# Patient Record
Sex: Female | Born: 1995
Health system: Southern US, Community
[De-identification: ages and names within clinical notes are randomized; demographics above are authoritative.]

## PROBLEM LIST (undated history)

## (undated) DIAGNOSIS — T7840XA Allergy, unspecified, initial encounter: Secondary | ICD-10-CM

## (undated) HISTORY — DX: Allergy, unspecified, initial encounter: T78.40XA

---
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
## 2014-06-21 ENCOUNTER — Other Ambulatory Visit: Payer: Self-pay | Admitting: Family Medicine

## 2014-06-21 ENCOUNTER — Ambulatory Visit
Admission: RE | Admit: 2014-06-21 | Discharge: 2014-06-21 | Disposition: A | Discharge status: Home / Self Care | Payer: 59 | Source: Ambulatory Visit | Attending: Family Medicine | Admitting: Family Medicine

## 2014-06-21 DIAGNOSIS — S8991XA Unspecified injury of right lower leg, initial encounter: Secondary | ICD-10-CM | POA: Diagnosis not present

## 2014-06-21 DIAGNOSIS — R52 Pain, unspecified: Secondary | ICD-10-CM

## 2014-06-21 DIAGNOSIS — Y939 Activity, unspecified: Secondary | ICD-10-CM | POA: Insufficient documentation

## 2014-06-21 DIAGNOSIS — W2107XA Struck by softball, initial encounter: Secondary | ICD-10-CM | POA: Diagnosis not present

## 2014-10-28 DIAGNOSIS — M25522 Pain in left elbow: Secondary | ICD-10-CM | POA: Diagnosis not present

## 2014-11-04 ENCOUNTER — Other Ambulatory Visit: Payer: Self-pay | Admitting: Family Medicine

## 2014-11-04 DIAGNOSIS — M25522 Pain in left elbow: Secondary | ICD-10-CM | POA: Diagnosis not present

## 2014-11-12 ENCOUNTER — Ambulatory Visit
Admission: RE | Admit: 2014-11-12 | Discharge: 2014-11-12 | Disposition: A | Discharge status: Home / Self Care | Payer: 59 | Source: Ambulatory Visit | Attending: Family Medicine | Admitting: Family Medicine

## 2014-11-12 DIAGNOSIS — M25522 Pain in left elbow: Secondary | ICD-10-CM | POA: Insufficient documentation

## 2015-01-20 ENCOUNTER — Ambulatory Visit: Payer: PRIVATE HEALTH INSURANCE | Admitting: Physical Therapy

## 2015-01-20 ENCOUNTER — Ambulatory Visit: Payer: 59 | Admitting: Physical Therapy

## 2015-01-24 ENCOUNTER — Encounter: Payer: PRIVATE HEALTH INSURANCE | Admitting: Physical Therapy

## 2015-01-24 ENCOUNTER — Ambulatory Visit: Payer: 59 | Attending: Family Medicine | Admitting: Physical Therapy

## 2015-01-24 ENCOUNTER — Encounter: Payer: Self-pay | Admitting: Physical Therapy

## 2015-01-24 DIAGNOSIS — M25522 Pain in left elbow: Secondary | ICD-10-CM | POA: Diagnosis not present

## 2015-01-24 NOTE — Therapy (Signed)
[place]INE [address] [place], [address] Phone: [phone]   Fax:  [phone]  Physical Therapy Evaluation  Patient Details  Name: Teresa Sims MRN: [id number] Date of Birth: [birth year] Referring Provider: Allena Katz  Encounter Date: 01/24/2015      PT End of Session - 01/24/15 1723    Visit Number 1   Number of Visits 13   Date for PT Re-Evaluation 03/09/15   PT Start Time 1618   PT Stop Time 1718   PT Time Calculation (min) 60 min   Activity Tolerance Patient tolerated treatment well   Behavior During Therapy [facility] for tasks assessed/performed      Past Medical History  Diagnosis Date  . Allergy     No past surgical history on file.  There were no vitals filed for this visit.  Visit Diagnosis:  Left elbow pain - Plan: PT plan of care cert/re-cert      Subjective Assessment - 01/24/15 1623    Subjective left elbow pain with throwing.   Pertinent History Pt has had left elbow pain for about 1 year. 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 Denies N/T, shoulder pain, neck pain.    Diagnostic tests MRI - negative   Patient Stated Goals return to throwing pain free. being able to lift weights.   Currently in Pain? No/denies            [facility] PT Assessment - 01/24/15 0001    Assessment   Medical Diagnosis Elbow muscle group tenderness/tendinopathy   Referring Provider Patel   Hand Dominance Left   Prior Therapy none   Precautions   Precautions None   Balance Screen   Has the patient fallen in the past 6 months No   Has the patient had a decrease in activity level because of a fear of falling?  No   Is the patient reluctant to leave their home because of a fear of falling?  No   ROM / Strength   AROM / PROM / Strength AROM;Strength   AROM   Overall AROM Comments R UE all movements WNL. L UE limited supination by 40 deg., decr.  radial deviation (to neutral), pain with end range active and passive wrist flexion and extension with elbow bent and straight.   Strength   Overall Strength Comments all strength is 5/5 except brachoradialis 3/5 with pain, resisted wrist flexion, wrist extension in end range both 4/5.   Palpation   Palpation comment pain with alpation of common extensor tendon, pronator teres, supinator, FCR and FCU.       Objective: Trigger point dry needling performed on multiple points: FCU, FCR, ECT, multiple needles, piston, piston and twist, maceration techniques used.  Following this pt reported her forearm was very sore but had decr. Pain with end range wrist extension. No change with limited radial deviation.  Carpoulnar glides performed to facilitate radial deviation, 5x1 min grade III. Improved radial deviation by 10 degr.  Elbow medial glides 5x1 min. Following this pt had improvement in supination to WNL.                    PT Education - 01/24/15 1722    Education provided Yes   Education Details educated pt to try throwing with her father over the next day or two to assess baseline throwing pain.   Person(s) Educated Patient  Methods Explanation   Comprehension Verbalized understanding             PT Long Term Goals - 01/24/15 1726    PT LONG TERM GOAL #1   Title pt will be able to throw softball 30 min pain free to be able to participate in scholarship sport in college.   Baseline pain with 20 min.   Time 6   Period Weeks   Status New   PT LONG TERM GOAL #2   Title Pt will improve L brachoradialis MMT to 5/5 pain free to reduce compensatory bricep activation with throwing.   Baseline 3/5   Time 6   Period Weeks   Status New               Plan - 01/24/15 1723    Clinical Impression Statement Pt is a pleasant 19 y/o female with c/o chronic L shoulder pain with throwing and lifting activities. 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 Currently pt presents with impaired joint play in L elbow and wrist, significant stiffness in L elbow and wrist, muscle weakness with L wrist extension, L elbow flexion in neutral to bias brachoradialis, and pain. pt would benefit from skilled PT to address these issues and allow return to PLOF.   Pt will benefit from skilled therapeutic intervention in order to improve on the following deficits Pain;Impaired UE functional use;Decreased range of motion;Improper body mechanics;Decreased strength   Rehab Potential Good   Clinical Impairments Affecting Rehab Potential chronic pain, age, activity level, throwing requirements, scholarship athlete   PT Frequency 2x / week   PT Duration 6 weeks   PT Treatment/Interventions ADLs/Self Care Home Management;Passive range of motion;Dry needling;Therapeutic exercise;Manual techniques   PT Next Visit Plan reassess pt, dry needling, manual tx, progress to sustained stretches.   Consulted and Agree with Plan of Care Patient         Problem List There are no active problems to display for this patient.   Aylyn Wenzler PT DPT 01/24/2015, 5:29 PM  [facility] [facility] [address] [place], [place], [address] Phone: [phone]   Fax:  [phone]  Name: Teresa Sims MRN: [id number] Date of Birth: 09/09/1995

## 2015-02-02 ENCOUNTER — Ambulatory Visit: Payer: 59 | Admitting: Physical Therapy

## 2015-02-02 DIAGNOSIS — M25522 Pain in left elbow: Secondary | ICD-10-CM | POA: Diagnosis not present

## 2015-02-02 NOTE — Therapy (Signed)
[facility] [address] [place], [place], [address] Phone: [phone]   Fax:  [phone]  Physical Therapy Treatment  Patient Details  Name: Teresa Sims MRN: [id number] Date of Birth: 08/14/1995 Referring Provider: Allena KatzPatel  Encounter Date: 02/02/2015      PT End of Session - 02/02/15 1026    Visit Number 2   Number of Visits 13   Date for PT Re-Evaluation 03/09/15   PT Start Time 0945   PT Stop Time 1015   PT Time Calculation (min) 30 min   Activity Tolerance Patient tolerated treatment well   Behavior [facility] Therapy [facility] for tasks assessed/performed      Past Medical History  Diagnosis Date  . Allergy     No past surgical history on file.  There were no vitals filed for this visit.  Visit Diagnosis:  Left elbow pain      Subjective Assessment - 02/02/15 0948    Subjective Pt tried throwing with her father, felt some improvement but only able to throw 20-30 times at 275' before noticing stiffness in elbow.   Pertinent History Pt has had left elbow pain for about 1 year. 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 Denies N/T, shoulder pain, neck pain.    Diagnostic tests MRI - negative   Patient Stated Goals return to throwing pain free. being able to lift weights.   Currently in Pain? No/denies                  objective: Reviewed and corrected stretches for wrist flexors.  Trigger point dry needling in triceps, pronator teres, wrist flexor common muscle belly. Twist and piston technique used.  Medial and lateral elbow glides 3x45 sec. Each grade IV with pt performing active wrist flexion to extension with and without finger addition.  Push ups - pt able to perform well.  Modified push up for triceps activation - pt had difficulty with this. Encouraged pt to perform this at home on knees.               PT Education -  02/02/15 1025    Education provided Yes   Education Details modification of warmup routine with stretching.   Person(s) Educated Patient   Methods Explanation   Comprehension Verbalized understanding             PT Long Term Goals - 01/24/15 1726    PT LONG TERM GOAL #1   Title pt will be able to throw softball 30 min pain free to be able to participate in scholarship sport in college.   Baseline pain with 20 min.   Time 6   Period Weeks   Status New   PT LONG TERM GOAL #2   Title Pt will improve L brachoradialis MMT to 5/5 pain free to reduce compensatory bricep activation with throwing.   Baseline 3/5   Time 6   Period Weeks   Status New               Plan - 02/02/15 1026    Clinical Impression Statement Pt demonstrates continued weakness in L elbow extension, pain with palpation of triceps. Improvement in L wrist supination. No change in pain. Modified pt performance of warmup to add in stretches as throwing becomes painful to attempt to mediate/improve endurance for exercises.   Pt will benefit from skilled therapeutic intervention in order to  improve on the following deficits Pain;Impaired UE functional use;Decreased range of motion;Improper body mechanics;Decreased strength   Rehab Potential Good   Clinical Impairments Affecting Rehab Potential chronic pain, age, activity level, throwing requirements, scholarship athlete   PT Frequency 2x / week   PT Duration 6 weeks   PT Treatment/Interventions ADLs/Self Care Home Management;Passive range of motion;Dry needling;Therapeutic exercise;Manual techniques   PT Next Visit Plan reassess pt, dry needling, manual tx, progress to sustained stretches.   Consulted and Agree with Plan of Care Patient        Problem List There are no active problems to display for this patient.   Oni Dietzman PT DPT 02/02/2015, 10:28 AM  [place] [facility] PHYS[facility] AND SPORTS MEDICINE [address] [place], [address] Phone: [phone]   Fax:  [phone]  Name: Teresa Sims MRN: [id number] Date of Birth: [birth year]

## 2015-02-03 ENCOUNTER — Encounter: Payer: PRIVATE HEALTH INSURANCE | Admitting: Physical Therapy

## 2015-02-15 ENCOUNTER — Encounter: Payer: PRIVATE HEALTH INSURANCE | Admitting: Physical Therapy

## 2015-02-18 ENCOUNTER — Ambulatory Visit: Payer: 59 | Attending: Family Medicine | Admitting: Physical Therapy

## 2015-02-18 DIAGNOSIS — M25522 Pain in left elbow: Secondary | ICD-10-CM | POA: Diagnosis present

## 2015-02-18 NOTE — Therapy (Signed)
[facility] [address] [place], [place], [address] Phone: [phone]   Fax:  [phone]  Physical Therapy Treatment  Patient Details  Name: Teresa Sims MRN: [id number] Date of Birth: 09/22/1995 Referring Provider: Allena KatzPatel  Encounter Date: 02/18/2015      PT End of Session - 02/18/15 0815    Visit Number 3   Number of Visits 13   Date for PT Re-Evaluation 03/09/15   PT Start Time 0730   PT Stop Time 0805   PT Time Calculation (min) 35 min   Activity Tolerance Patient tolerated treatment well   Behavior During Therapy [facility] for tasks assessed/performed      Past Medical History  Diagnosis Date  . Allergy     No past surgical history on file.  There were no vitals filed for this visit.  Visit Diagnosis:  Left elbow pain      Subjective Assessment - 02/18/15 0741    Subjective pt reports she has decreased pain at rest as well as post softball practice. Able to throw many times at sub max speed w/o pain/stiffness   Pertinent History Pt has had left elbow pain for about 1 year. 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 Denies N/T, shoulder pain, neck pain.    Diagnostic tests MRI - negative   Patient Stated Goals return to throwing pain free. being able to lift weights.   Currently in Pain? No/denies       Objective:   Pt performed reverse chops w/ green resistance band 2 sec holds at top of elevation and 3 sec lowering 10X3 w/ verbal and tactile cueing on the low/mid trap to increase activation.  Performed TRX scapular retractions in Y positioning X10 w/ 3 sec holds at top. Progressed to oscillation sets at mid ROM 6X3. With oscillation set more challenging.   Isometric perturbations w/ 6.5# medicine ball against wall, shoulder abd to 90 deg elbow flexed to 90 deg X10, elbow straight shoulder flexed to 140 deg X10, shoulder flexed to 180 deg bent  over at hips. Verbal and tactile cueing of mid/low trap to increase activation.  Dry needling performed at distal triceps with two needles utilizing pistoning and twisting techniques. Pt responded well, reported decr. Ability to produce her own pain by palpating. Grade 4 Lateral glide mobilizations performed at L elbow X 2 for bouts of 45 sec.                              PT Education - 02/18/15 0755    Education Details pt was instructed to hold off on max throwing speed untill after next visit and provided no pain   Person(s) Educated Patient   Methods Explanation   Comprehension Verbalized understanding             PT Long Term Goals - 01/24/15 1726    PT LONG TERM GOAL #1   Title pt will be able to throw softball 30 min pain free to be able to participate in scholarship sport in college.   Baseline pain with 20 min.   Time 6   Period Weeks   Status New   PT LONG TERM GOAL #2   Title Pt will improve L brachoradialis MMT to 5/5 pain free to reduce compensatory bricep activation with throwing.   Baseline 3/5   Time 6  Period Weeks   Status New               Plan - 02/18/15 0817    Clinical Impression Statement Pt is progressing well as seen by a decrease in pain, increased endurance in throwing capacity and strength, continues to have tenderness just distal to medial L epicondyle   Pt will benefit from skilled therapeutic intervention in order to improve on the following deficits Pain;Impaired UE functional use;Decreased range of motion;Improper body mechanics;Decreased strength   Rehab Potential Good   Clinical Impairments Affecting Rehab Potential chronic pain, age, activity level, throwing requirements, scholarship athlete   PT Frequency 2x / week   PT Duration 6 weeks   PT Treatment/Interventions ADLs/Self Care Home Management;Passive range of motion;Dry needling;Therapeutic exercise;Manual techniques   PT Next Visit Plan reassess pt,  manual tx, progress to increased endurance in low/mid trap isometrics   Consulted and Agree with Plan of Care Patient        Problem List There are no active problems to display for this patient.   Rodrigus Kilker PT DPT 02/18/2015, 9:01 AM  [facility] PHYS[facility] AND SPORTS MEDICINE [address], [place], [address] Phone: [phone]   Fax:  [phone]  Name: Teresa Sims MRN: [id number] Date of Birth: 22-Jul-1995

## 2015-02-18 NOTE — Patient Instructions (Signed)
Pt instructed to not throw at max capacity until she is seen next time

## 2015-02-22 ENCOUNTER — Ambulatory Visit: Payer: 59 | Admitting: Physical Therapy

## 2015-02-22 DIAGNOSIS — M25522 Pain in left elbow: Secondary | ICD-10-CM | POA: Diagnosis not present

## 2015-02-22 NOTE — Therapy (Signed)
[place] [facility] [address] [place], [address] Phone: [phone]   Fax:  [phone]  Physical Therapy Treatment  Patient Details  Name: Teresa Sims MRN: [id number] Date of Birth: 01/05/1996 Referring Provider: Allena Katz  Encounter Date: 02/22/2015      PT End of Session - 02/22/15 0811    Visit Number 4   Number of Visits 13   Date for PT Re-Evaluation 03/09/15   PT Start Time 0730   PT Stop Time 0810   PT Time Calculation (min) 40 min   Activity Tolerance Patient tolerated treatment well;No increased pain   Behavior During Therapy [facility] for tasks assessed/performed      Past Medical History  Diagnosis Date  . Allergy     No past surgical history on file.  There were no vitals filed for this visit.  Visit Diagnosis:  Left elbow pain      Subjective Assessment - 02/22/15 0729    Subjective Pt reports she did the kind of throwing she would do in a game when throwing out a runner during her last practice, which produced her previous pain symptoms immediately following 12 pitches. She is currently more sore than just distal to medial epicondyle.   Pertinent History Pt has had left elbow pain for about 1 year. 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 Denies N/T, shoulder pain, neck pain.    Diagnostic tests MRI - negative   Patient Stated Goals return to throwing pain free. being able to lift weights.            Objective: Half kneeling w/ knee on foam pad pt performed elbow extensions in combination with forearm pronation w/ gray resistance band 3X12 to address pain with pronation with throwing and elbow extension Attempted dips w/ feet on floor knees straight back to mat/ pt experienced discomfort at L medial epicondyle. Discontinued exercise to reduce fatigue/discomfort in consideration to later softball practice.  Isometric perturbations  w/ 6.5# medicine ball against wall, shoulder abd to 90 deg elbow flexed to 90 deg, elbow straight shoulder flexed to 140 deg X10, shoulder flexed to 180 deg bent over at hips. Each position was sustained 60 sec for the first set then 45 sec X 2. Pt extremely fatigued with performance indicating continued weakness in scapula  3 sets of grade 4 lateral glide mobilizations at elbow performed 45 sec w/ elbow straight and pt seated. STM done just distal to medial epicondyle/triceps insertion between lat glide sets. Pt responded well to therapy and had a notable decrease in tissue tension distal to medial epicondyle.   Dry needling performed at proximal flexor/pronator group as well as ECRB utilizing pistoning and twisting techniques. Pt responded well, with decr. Pain following LTR in ECRB.                     PT Education - 02/22/15 0808    Education Details pt was encouraged to continue throwing during practice and be aware of how it impacts the pain.    Person(s) Educated Patient   Methods Explanation             PT Long Term Goals - 01/24/15 1726    PT LONG TERM GOAL #1   Title pt will be able to throw softball 30 min pain free to be able to participate in scholarship sport in college.   Baseline pain  with 20 min.   Time 6   Period Weeks   Status New   PT LONG TERM GOAL #2   Title Pt will improve L brachoradialis MMT to 5/5 pain free to reduce compensatory bricep activation with throwing.   Baseline 3/5   Time 6   Period Weeks   Status New               Plan - 02/22/15 1610    Clinical Impression Statement Pt is improving in throwing endurance, but remains limited by soreness. Pt will benefit from continued scapula stabilization training to decrease stresses at the medial elbow. Pt will benefit from flexor/pronator strengthening to improve throwing endurance.  Pt also continues to have pain with palpation of proximal forearm and would benefit from continued  PT to address this.   Pt will benefit from skilled therapeutic intervention in order to improve on the following deficits Pain;Impaired UE functional use;Decreased range of motion;Improper body mechanics;Decreased strength   Rehab Potential Good   Clinical Impairments Affecting Rehab Potential chronic pain, age, activity level, throwing requirements, scholarship athlete   PT Frequency 2x / week   PT Duration 6 weeks   PT Treatment/Interventions ADLs/Self Care Home Management;Passive range of motion;Dry needling;Therapeutic exercise;Manual techniques   PT Next Visit Plan reassess pt, manual tx, progress to increased endurance in low/mid trap isometrics   Consulted and Agree with Plan of Care Patient        Problem List There are no active problems to display for this patient.   Karstyn Birkey PT DPT 02/22/2015, 10:08 AM   [facility] PHYSICAL AND SPORTS MEDICINE [address], [place], [address] Phone: [phone]   Fax:  [phone]  Name: Teresa Sims MRN: [id number] Date of Birth: 1995/07/03

## 2015-03-01 ENCOUNTER — Ambulatory Visit: Payer: 59 | Admitting: Physical Therapy

## 2015-03-01 DIAGNOSIS — M25522 Pain in left elbow: Secondary | ICD-10-CM

## 2015-03-01 NOTE — Therapy (Signed)
[place] [facility]TS MEDIC[facility] [address] [place], [address] Phone: [phone]   Fax:  [phone]  Physical Therapy Treatment  Patient Details  Name: Adelina Collard MRN: [id number] Date of Birth: 23-Jan-1996 Referring Provider: Allena Katz  Encounter Date: 03/01/2015      PT End of Session - 03/01/15 0910    Visit Number 5   Number of Visits 13   Date for PT Re-Evaluation 03/09/15   PT Start Time 0730   PT Stop Time 0810   PT Time Calculation (min) 40 min   Activity Tolerance Patient tolerated treatment well;No increased pain   Behavior During Therapy [facility] for tasks assessed/performed      Past Medical History  Diagnosis Date  . Allergy     No past surgical history on file.  There were no vitals filed for this visit.  Visit Diagnosis:  Left elbow pain      Subjective Assessment - 03/01/15 0806    Subjective Pt reports "it's feeling better for sure", reports has not been practicing softball over the last two days and will not have practice today. Reports that she feels the "shoulder exercises" have been most beneficial of the exercises we have done so far   Pertinent History Pt has had left elbow pain for about 1 year. 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 Denies N/T, shoulder pain, neck pain.    Diagnostic tests MRI - negative   Patient Stated Goals return to throwing pain free. being able to lift weights.   Currently in Pain? No/denies          Objective:  Performed overhead resisted throwing motion w/ GTB attached over door to address med epicondyle pain w/ throwing. Pt tolerated well, reported eccentric portion was most difficult portion, and verbalize fatigue in the area of supraspinatus and mid trap.  3X15 3 sec count eccentric and concentric portions. Manually resisted pronation in standing to address pain with resisted pronation. 3X10 concentric  pronation. 3X10 eccentric pronation. Pt had noted decrease in strength and mild pain at med epicondyle as pronation approached neutral, decreased resistance applied.  Static rows done on cable machine 35# 4X30 sec. Done to increase scapular retraction/depression during resisted motions. Verbal/tactile cues needed to depress/retract scapula.  Lat glide mob at L elbow to address med elbow pain. Noted taut band from med epicondyle to mid forearm. 3 bouts at 45 sec, pt tolerated well.  Ulnar glide mob of hand pt in sitting to address radial deviation deficit. Pt tolerated well, demonstrated increased radial deviation post mob Dry needling of common wrist extensor, two needles inserted w/ twitch response evident.                        PT Education - 03/01/15 0909    Education provided Yes   Education Details instructed to continue static rows with scapualer retraction focus   Person(s) Educated Patient   Methods Explanation;Demonstration   Comprehension Verbalized understanding;Returned demonstration;Verbal cues required             PT Long Term Goals - 01/24/15 1726    PT LONG TERM GOAL #1   Title pt will be able to throw softball 30 min pain free to be able to participate in scholarship sport in college.   Baseline pain with 20 min.   Time 6   Period Weeks   Status New  PT LONG TERM GOAL #2   Title Pt will improve L brachoradialis MMT to 5/5 pain free to reduce compensatory bricep activation with throwing.   Baseline 3/5   Time 6   Period Weeks   Status New               Plan - 03/01/15 0911    Clinical Impression Statement Pt is showing improved endurance in throwing capacity/volume and decreased soreness. Reassess med epicondyle/triceps/BR pain post softball practice of high intensity. Advance scapular stabilization progression, continue dry needling prn, as these are what pt has reported help with pain management.     Pt will benefit from skilled  therapeutic intervention in order to improve on the following deficits Pain;Impaired UE functional use;Decreased range of motion;Improper body mechanics;Decreased strength   Rehab Potential Good   Clinical Impairments Affecting Rehab Potential chronic pain, age, activity level, throwing requirements, scholarship athlete   PT Frequency 2x / week   PT Duration 6 weeks   PT Treatment/Interventions ADLs/Self Care Home Management;Passive range of motion;Dry needling;Therapeutic exercise;Manual techniques   PT Next Visit Plan reassess pt, manual tx, progress to increased endurance in low/mid trap isometrics   Consulted and Agree with Plan of Care Patient        Problem List There are no active problems to display for this patient.   Emilia Beck Rij SPT 03/01/2015, 9:51 AM  Su Hoff PT DPT  [place] [facility] PHYSICAL AND SPORTS MEDICINE [address] [place], [address] Phone: [phone]   Fax:  [phone]  Name: Jordyn Hofacker MRN: [id number] Date of Birth: [birth year]

## 2015-03-08 ENCOUNTER — Ambulatory Visit: Payer: 59 | Admitting: Physical Therapy

## 2015-03-08 DIAGNOSIS — M25522 Pain in left elbow: Secondary | ICD-10-CM

## 2015-03-08 NOTE — Therapy (Signed)
[facility] [address] [place], [address] Phone: [phone]   Fax:  [phone]  Physical Therapy Treatment  Patient Details  Name: Teresa Sims MRN: [id number] Date of Birth: 27-Feb-1995 Referring Provider: Allena Katz  Encounter Date: 03/08/2015      PT End of Session - 03/08/15 1220    Visit Number 6   Number of Visits 13   Date for PT Re-Evaluation 03/09/15   PT Start Time 1045   PT Stop Time 1130   PT Time Calculation (min) 45 min   Activity Tolerance Patient tolerated treatment well;No increased pain   Behavior During Therapy [facility] for tasks assessed/performed      Past Medical History  Diagnosis Date  . Allergy     No past surgical history on file.  There were no vitals filed for this visit.  Visit Diagnosis:  Left elbow pain      Subjective Assessment - 03/08/15 1047    Subjective Pt reports she is having more pain in med epicondyle/ant forearm in the last few days, also reports experiencing pain when "sliding during softball, I realized I drag my hand when sliding" in a manner that stretches flexor/pronator group of wrist. Reports increase pain with sliding/dragging hand med epicondyle. Triceps and forearm pain remain the same as they have been.   Pertinent History Pt has had left elbow pain for about 1 year. 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 Denies N/T, shoulder pain, neck pain.    Diagnostic tests MRI - negative   Patient Stated Goals return to throwing pain free. being able to lift weights.   Currently in Pain? No/denies      Objective:  Performed single arm bent over rows w/ 20# 2X10, 25# 1X10 3 sec holds with retraction. Pt responded well, requiring occasional manual cueing to gain full scapula retraction/depression. Performed prone horizontal shoulder abduction w/o wt. for time 3X60 sec. Pt demonstrated decreased  ER/supination/abduction of L UE. Reported tenderness at deltoids insertion/biceps/supraspinatus area.  Performed ischemic compression of supraspinatus combined w/ AROM horizontal abd/ER/supination 2X10. Followed with MWM horizontal abduction/ER of shoulder combined w/ STM of supraspinatus 2X10. Pt responded well as reported by decreased UE tension/TP and full ROM upon demonstration of prone horizontal abduction.                              PT Education - 03/08/15 1055    Education provided Yes   Education Details educated to to contract glues and ER arms during prone horizontal arm abduction   Person(s) Educated Patient   Methods Explanation;Demonstration;Tactile cues;Verbal cues   Comprehension Returned demonstration;Verbalized understanding             PT Long Term Goals - 03/08/15 1230    PT LONG TERM GOAL #1   Title pt will be able to throw softball 30 min pain free to be able to participate in scholarship sport in college.   Baseline pain with 20 min.   Time 6   Period Weeks   Status Achieved   PT LONG TERM GOAL #2   Title Pt will improve L brachoradialis MMT to 5/5 pain free to reduce compensatory bricep activation with throwing.   Baseline 3/5   Time 6   Period Weeks   Status New   PT LONG TERM GOAL #3   Title Pt  will be able to throw 20 full capacity softballs with 0/10 pain to enable unrestricted softball practice/gams   Baseline 12 before pain onset   Time 4   Period Weeks   Status New               Plan - 03/08/15 1221    Clinical Impression Statement Pt has limited capacity when throwing at high speeds or at lower speeds for longer durations during softball due to med epicondyle pain, assess for continued TP/tightness in triceps, deltoids, and medial forearm. Develop effective shoulder stabilization HEP as this is what pt has reported to be most beneficial.    Pt will benefit from skilled therapeutic intervention in order to improve  on the following deficits Pain;Impaired UE functional use;Decreased range of motion;Improper body mechanics;Decreased strength   Rehab Potential Good   Clinical Impairments Affecting Rehab Potential chronic pain, age, activity level, throwing requirements, scholarship athlete   PT Frequency 2x / week   PT Duration 6 weeks   PT Treatment/Interventions ADLs/Self Care Home Management;Passive range of motion;Dry needling;Therapeutic exercise;Manual techniques   PT Next Visit Plan reassess pt, manual tx, progress to increased endurance in low/mid trap isometrics   Consulted and Agree with Plan of Care Patient        Problem List There are no active problems to display for this patient.   Emilia Beck Rij 03/08/2015, 12:34 PM  Su Hoff PT DPT  [facility] PHYS[facility] AND SPORTS MEDICINE [address] [place], [address] Phone: [phone]   Fax:  [phone]  Name: Teresa Sims MRN: [id number] Date of Birth: 28-Feb-1995

## 2015-03-10 ENCOUNTER — Encounter: Payer: Self-pay | Admitting: Physical Therapy

## 2015-03-10 ENCOUNTER — Ambulatory Visit: Payer: 59 | Attending: Family Medicine | Admitting: Physical Therapy

## 2015-03-10 DIAGNOSIS — M25522 Pain in left elbow: Secondary | ICD-10-CM | POA: Diagnosis not present

## 2015-03-10 DIAGNOSIS — M6281 Muscle weakness (generalized): Secondary | ICD-10-CM | POA: Insufficient documentation

## 2015-03-10 NOTE — Therapy (Signed)
[facility] [address], [place], [address] Phone: [phone]   Fax:  [phone]  Physical Therapy Treatment  Patient Details  Name: Teresa Sims MRN: [id number] Date of Birth: 09-03-1995 Referring Provider: Allena Katz  Encounter Date: 03/10/2015      PT End of Session - 03/10/15 1123    Visit Number 7   Number of Visits 13   Date for PT Re-Evaluation 03/09/15   PT Start Time 1045   PT Stop Time 1125   PT Time Calculation (min) 40 min   Activity Tolerance Patient tolerated treatment well;No increased pain   Behavior During Therapy [facility] for tasks assessed/performed      Past Medical History  Diagnosis Date  . Allergy     History reviewed. No pertinent past surgical history.  There were no vitals filed for this visit.  Visit Diagnosis:  Left elbow pain      Subjective Assessment - 03/10/15 1045    Subjective Pt reports soreness in L bicep at rest and incr duirng softball practice.  States that hitting slightly brings on pain but no other problems noted.   Pertinent History Pt has had left elbow pain for about 1 year. 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 Denies N/T, shoulder pain, neck pain.    Diagnostic tests MRI - negative   Patient Stated Goals return to throwing pain free. being able to lift weights.   Currently in Pain? No/denies   Multiple Pain Sites No       Objective:  Dry needling - one needle in L deltoid, one in bicep, one in flexor muscles, and one in pronator teres.  Pt reported some incr soreness after needling due to a high muscle response at noted TPs. 3x10 SL ER 4# first set and 3# for second and third sets with L UE to incr strength and decr pain.  Cuing needed to use a three count for both concentric and eccentric contractions.  Pt reported an incr in difficulty when slowing down the repetitions. 3x10 reverse chops GTB  with L UE to incr strength to allow pt to throw and hit softball efficiently.  Cuing needed to maintain scapular retraction and depression.  Pt reported no pain during exercise. Manually resisted pronation 3x10 to improve strength in R forearm.  PT noted improved strength in the second half of resisted pronation with no incr in pain reported by pt.                          PT Education - 03/10/15 1127    Education provided Yes   Education Details Pt educated on progression of therapy as she begins her softball season.  Also educated to perform static row holds and isometric superman.   Person(s) Educated Patient   Methods Explanation;Demonstration;Verbal cues   Comprehension Verbalized understanding;Returned demonstration             PT Long Term Goals - 03/08/15 1230    PT LONG TERM GOAL #1   Title pt will be able to throw softball 30 min pain free to be able to participate in scholarship sport in college.   Baseline pain with 20 min.   Time 6   Period Weeks   Status Achieved   PT LONG TERM GOAL #2   Title Pt will improve L brachoradialis MMT to 5/5 pain free  to reduce compensatory bricep activation with throwing.   Baseline 3/5   Time 6   Period Weeks   Status New   PT LONG TERM GOAL #3   Title Pt will be able to throw 20 full capacity softballs with 0/10 pain to enable unrestricted softball practice/gams   Baseline 12 before pain onset   Time 4   Period Weeks   Status New               Plan - 03/10/15 1123    Clinical Impression Statement Pt demonstrated significant improvement in scapular strength as evidenced by decr difficulty with exercises and ability to maintain strength gains from session to session.  Dry needling continues to help pt by decr pain in L UE at several noted TPs that were tender to palpation in the L deltoid, bicep, and forearm.  Moving forward, pt will be seen for dry needling PRN and would continue to benefit from skilled  PT to address lingering deficits.   Pt will benefit from skilled therapeutic intervention in order to improve on the following deficits Pain;Impaired UE functional use;Decreased range of motion;Improper body mechanics;Decreased strength   Rehab Potential Good   Clinical Impairments Affecting Rehab Potential chronic pain, age, activity level, throwing requirements, scholarship athlete   PT Frequency 2x / week   PT Duration 6 weeks   PT Treatment/Interventions ADLs/Self Care Home Management;Passive range of motion;Dry needling;Therapeutic exercise;Manual techniques   PT Next Visit Plan reassess pt, manual tx, progress to increased endurance in low/mid trap isometrics   Consulted and Agree with Plan of Care Patient        Problem List There are no active problems to display for this patient.   Vilinda Flake SPT 03/10/2015, 11:31 AM  Su Hoff PT DPT  [facility] PHYSICAL AND SPORTS MEDICINE [address], [place], [address] Phone: [phone]   Fax:  [phone]  Name: Teresa Sims MRN: [id number] Date of Birth: [birth year]

## 2015-03-15 ENCOUNTER — Ambulatory Visit: Payer: 59 | Admitting: Physical Therapy

## 2015-03-15 DIAGNOSIS — M25522 Pain in left elbow: Secondary | ICD-10-CM

## 2015-03-15 DIAGNOSIS — M6281 Muscle weakness (generalized): Secondary | ICD-10-CM

## 2015-03-15 NOTE — Therapy (Signed)
[facility] [address], [place], [address] Phone: [phone]   Fax:  [phone]  Physical Therapy Treatment  Patient Details  Name: Teresa Sims MRN: [id number] Date of Birth: [birth year] Referring Provider: Allena Katz  Encounter Date: 03/15/2015      PT End of Session - 03/15/15 1131    Visit Number 8   Number of Visits 13   Date for PT Re-Evaluation 04/20/15   PT Start Time 1015   PT Stop Time 1100   PT Time Calculation (min) 45 min   Activity Tolerance Patient tolerated treatment well;No increased pain   Behavior During Therapy [facility] for tasks assessed/performed      Past Medical History  Diagnosis Date  . Allergy     No past surgical history on file.  There were no vitals filed for this visit.  Visit Diagnosis:  Left elbow pain  Muscle weakness      Subjective Assessment - 03/15/15 1026    Subjective Pt reports no pain, and feeling better than last session likely due to prolonged 3 day rest break from softball. Pt states she "feels ready for the game" this weekend, and is able to throw more with soreness lasting only .    Pertinent History Pt has had left elbow pain for about 1 year. 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 Denies N/T, shoulder pain, neck pain.    Diagnostic tests MRI - negative   Patient Stated Goals return to throwing pain free. being able to lift weights.   Currently in Pain? No/denies       Objective:   Performed dry needling of brachiradialis, needle insertion at two location, pistoning techniquw with local twitch response noted. Decreased tissue tension noted post treatment.  TRX Ys and Ts performed 2X10, pt tolerated well reporting fatigue/soreness in medial trap area. Cueing needed to decrease shoulder elevation.   TRX static row holds 3X30 sec, pt tolerated well reporting this being the most difficult exercise  done in therapy to date. Cueing needed to decrease shoulder elevation.   Cable Triceps extensions w/ emphasis on pronation w/ terminal elbow extension, 3X10 at 15# X10, 20# 2X10. Cueing needed to increase pronation, shoulder depression/retraction.  Seated wrist extension w/ 5# DB over edge of table 3X12. Pt tolerated well reporting burning in BR/common extensor region.                             PT Education - 03/15/15 1034    Education provided Yes   Education Details discussed HEP components and options for long term maintenance   Person(s) Educated Patient   Methods Explanation;Demonstration   Comprehension Verbalized understanding;Returned demonstration             PT Long Term Goals - 03/08/15 1230    PT LONG TERM GOAL #1   Title pt will be able to throw softball 30 min pain free to be able to participate in scholarship sport in college.   Baseline pain with 20 min.   Time 6   Period Weeks   Status Achieved   PT LONG TERM GOAL #2   Title Pt will improve L brachoradialis MMT to 5/5 pain free to reduce compensatory bricep activation with throwing.   Baseline 3/5   Time 6   Period Weeks   Status New   PT LONG TERM  GOAL #3   Title Pt will be able to throw 20 full capacity softballs with 0/10 pain to enable unrestricted softball practice/gams   Baseline 12 before pain onset   Time 4   Period Weeks   Status New               Plan - 03/15/15 1132    Clinical Impression Statement Pt continues to experience mild pain at medial epicondyle, brachioradialis, triceps, and biceps aggravated by high volume and high velocity softball throwing; however, pain is manageable with HEP therapeutic exercises. Currently soreness lasts only on one day after high volume/capacity throwing. Address HEP options and exercise for continued long term maintenance.     Pt will benefit from skilled therapeutic intervention in order to improve on the following deficits  Pain;Impaired UE functional use;Decreased range of motion;Improper body mechanics;Decreased strength   Rehab Potential Good   Clinical Impairments Affecting Rehab Potential chronic pain, age, activity level, throwing requirements, scholarship athlete   PT Frequency 2x / week   PT Duration 6 weeks   PT Treatment/Interventions ADLs/Self Care Home Management;Passive range of motion;Dry needling;Therapeutic exercise;Manual techniques   PT Next Visit Plan reassess pt, manual tx, progress to increased endurance in low/mid trap isometrics   Consulted and Agree with Plan of Care Patient        Problem List There are no active problems to display for this patient.   Emilia Beck Rij SPT 03/15/2015, 11:51 AM  Su Hoff PT DPT PT supervised student throughout session.  [facility]IC[facility] [address], [place], [address] Phone: [phone]   Fax:  [phone]  Name: Teresa Sims MRN: [id number] Date of Birth: 24-Nov-1995

## 2015-03-22 ENCOUNTER — Ambulatory Visit: Payer: 59 | Admitting: Physical Therapy

## 2015-03-22 DIAGNOSIS — M6281 Muscle weakness (generalized): Secondary | ICD-10-CM

## 2015-03-22 DIAGNOSIS — M25522 Pain in left elbow: Secondary | ICD-10-CM | POA: Diagnosis not present

## 2015-03-22 NOTE — Therapy (Signed)
[facility] [address], [place], [address] Phone: [phone]   Fax:  [phone]  Physical Therapy Treatment/Discharge  Patient Details  Name: Teresa Sims MRN: [id number] Date of Birth: 07/18/1995 Referring Provider: Allena Katz  Encounter Date: 03/22/2015      PT End of Session - 03/22/15 1254    Visit Number 8   Number of Visits 13   Date for PT Re-Evaluation 04/20/15   PT Start Time 1115   PT Stop Time 1200   PT Time Calculation (min) 45 min   Activity Tolerance Patient tolerated treatment well;No increased pain   Behavior During Therapy [facility] for tasks assessed/performed      Past Medical History  Diagnosis Date  . Allergy     No past surgical history on file.  There were no vitals filed for this visit.  Visit Diagnosis:  No diagnosis found.      Subjective Assessment - 03/22/15 1249    Subjective Pt reports winning her softball games over the weekend without incident of increased pain beyond typical levels after throwing activities. She is currently experiencing mild pain at the superior half of brachioradiallis and distal inch of triceps, but no noticeable pain in the area of the medial epicondyle.     Pertinent History Pt has had left elbow pain for about 1 year. 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 Denies N/T, shoulder pain, neck pain.    Diagnostic tests MRI - negative   Patient Stated Goals return to throwing pain free. being able to lift weights.   Currently in Pain? No/denies        Objective:  Performed dry needling of brachioradialis, distal triceps, and slightly distal to medial epicondyle. Utilized pistoning technique, notable twitch response in triceps. Pt tolerated well reporting needles being in the right spot.  Performed superman variation w/ only UE elevated off and max shoulder ER 3X60 sec. Pt required cueing to  increase scapular retraction, difficulty maintain position for full 60 sec.   Performed STM from mid posterior forearm to common extensor tendon and as well as distal inch triceps. Pt reported posterior forearm being very sore with STM. Pt tolerated treatment well reporting feeling better post STM.  Unilateral bent over rows done w/ 40# kettle bell 3X10, contralateral arm was used to stabilize/WB pt on mat, pt tolerated well showing difficulty completing final repetitions of the last set.                            PT Education - 03/22/15 1252    Education provided Yes   Education Details performance of HEP exercises for prevention of aggravation    Person(s) Educated Patient   Methods Explanation;Demonstration   Comprehension Returned demonstration;Verbalized understanding             PT Long Term Goals - 03/08/15 1230    PT LONG TERM GOAL #1   Title pt will be able to throw softball 30 min pain free to be able to participate in scholarship sport in college.   Baseline pain with 20 min.   Time 6   Period Weeks   Status Achieved   PT LONG TERM GOAL #2   Title Pt will improve L brachoradialis MMT to 5/5 pain free to reduce compensatory bricep activation with throwing.   Baseline 3/5   Time 6  Period Weeks   Status New   PT LONG TERM GOAL #3   Title Pt will be able to throw 20 full capacity softballs with 0/10 pain to enable unrestricted softball practice/gams   Baseline 12 before pain onset   Time 4   Period Weeks   Status New               Plan - 03/22/15 1256    Clinical Impression Statement Pt shows considerable and consistent improvement in baseline pain levels, although some irritability occurs with prolonged throwing activities during softball practice.  Pt has demonstrated considerable scapula stabilization and UE strength improvements. Pt demonstrates consistent ability to self-manage symptoms upon mild aggravation through independent  performance of HEP. Pt has been provided with a long-term HEP for maintenance and prevention of aggravation.     Pt will benefit from skilled therapeutic intervention in order to improve on the following deficits Pain;Impaired UE functional use;Decreased range of motion;Improper body mechanics;Decreased strength   Rehab Potential Good   Clinical Impairments Affecting Rehab Potential chronic pain, age, activity level, throwing requirements, scholarship athlete   PT Frequency 2x / week   PT Duration 6 weeks   PT Treatment/Interventions ADLs/Self Care Home Management;Passive range of motion;Dry needling;Therapeutic exercise;Manual techniques   PT Next Visit Plan reassess pt, manual tx, progress to increased endurance in low/mid trap isometrics   Consulted and Agree with Plan of Care Patient        Problem List There are no active problems to display for this patient.   Emilia Beck Rij 03/22/2015, 12:57 PM  Su Hoff PT DPT  [facility] PHYSICAL AND SPORTS MEDICINE [address], [place], [address] Phone: [phone]   Fax:  [phone]  Name: Teresa Sims MRN: [id number] Date of Birth: April 18, 1995

## 2016-06-20 IMAGING — MR MR ELBOW*L* W/O CM
4 series · 40 of 40 positions shown · non-contrast
Comparison: None.

CLINICAL DATA: Softball player with elbow pain for 6 months. Good
range of motion. No acute injury or prior relevant surgery. Initial
encounter.

EXAM:
MRI OF THE LEFT ELBOW WITHOUT CONTRAST
TECHNIQUE: Multiplanar, multisequence MR imaging of the elbow was performed. No
intravenous contrast was administered.

[Series 6: T1 · axial · 4.0mm · 0.62mm/px · z∈[-85,+37]mm · 10 of 25 slices shown]
[im 1/25]
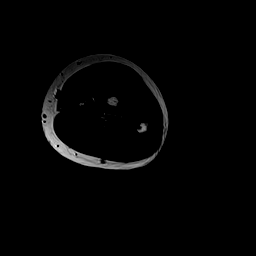
[im 3/25]
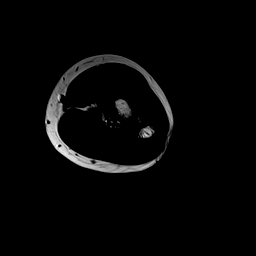
[im 6/25]
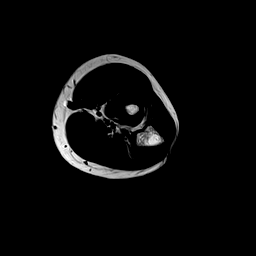
[im 9/25]
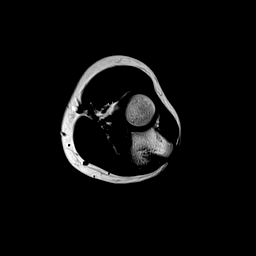
[im 11/25]
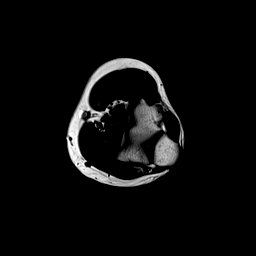
[im 14/25]
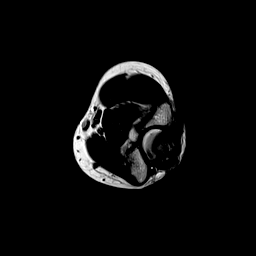
[im 17/25]
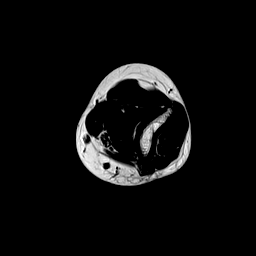
[im 19/25]
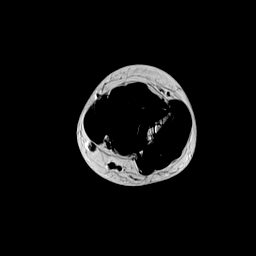
[im 22/25]
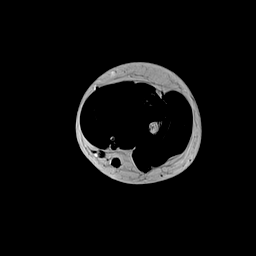
[im 25/25]
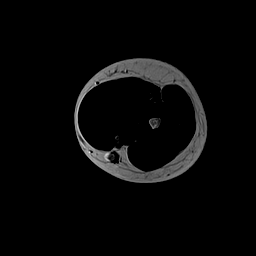

[Series 7: T2 fat-sat · axial · 4.0mm · 0.62mm/px · z∈[-85,+37]mm · 10 of 25 slices shown]
[im 1/25]
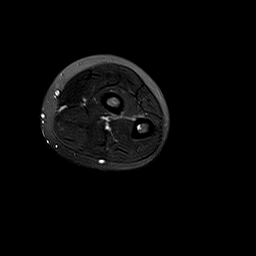
[im 3/25]
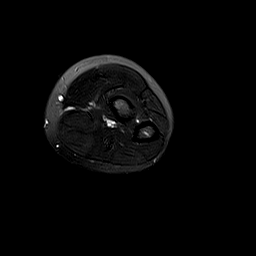
[im 6/25]
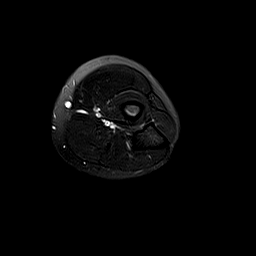
[im 9/25]
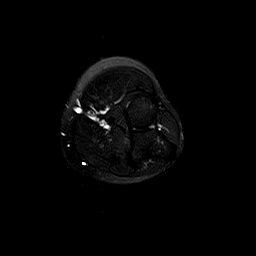
[im 11/25]
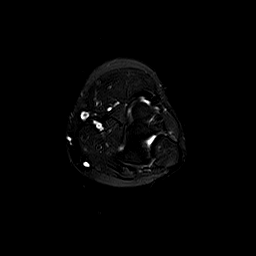
[im 14/25]
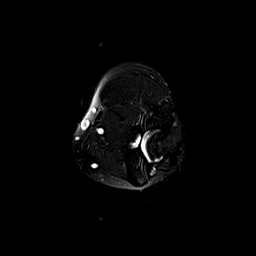
[im 17/25]
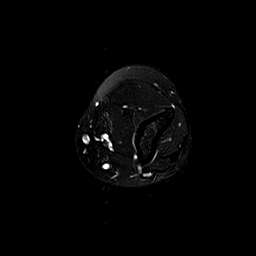
[im 19/25]
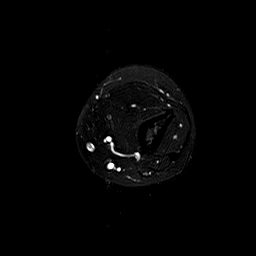
[im 22/25]
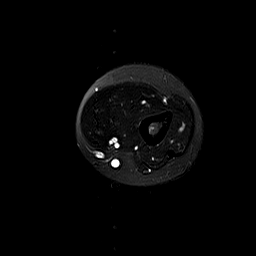
[im 25/25]
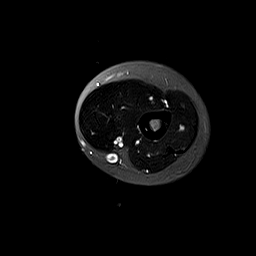

[Series 100: sagittal · coronal · 4.0mm · 0.70mm/px · 8 of 20 slices shown]
[im 1/20]
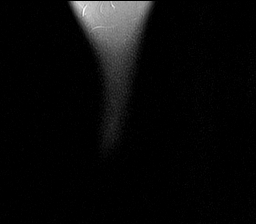
[im 3/20]
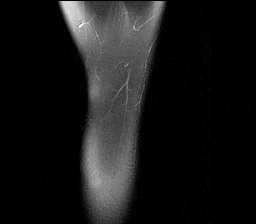
[im 6/20]
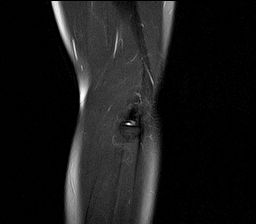
[im 9/20]
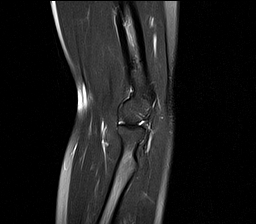
[im 11/20]
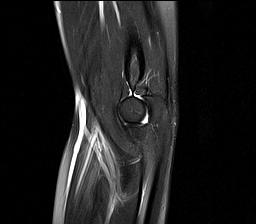
[im 14/20]
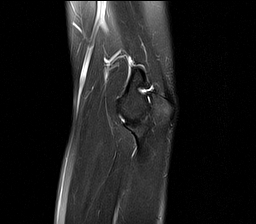
[im 17/20]
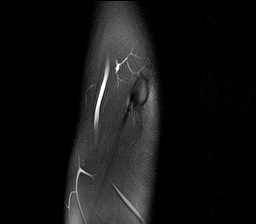
[im 20/20]
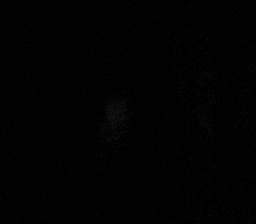

[Series 101: PD fat-sat · sagittal · 3.0mm · 0.62mm/px · 12 of 29 slices shown]
[im 1/29]
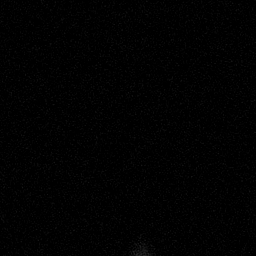
[im 3/29]
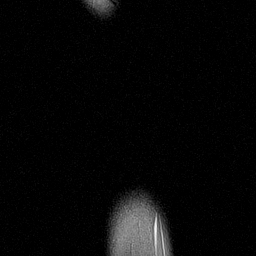
[im 6/29]
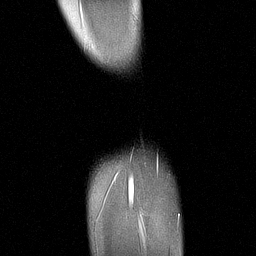
[im 8/29]
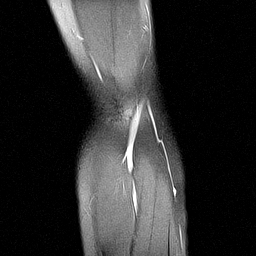
[im 11/29]
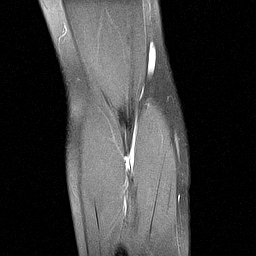
[im 13/29]
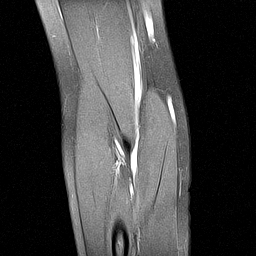
[im 16/29]
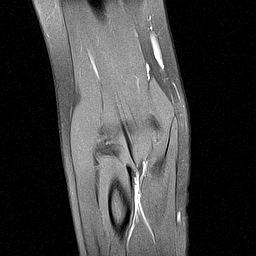
[im 18/29]
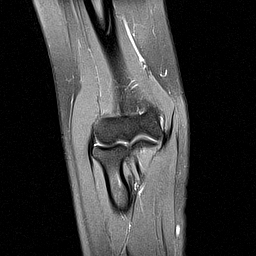
[im 21/29]
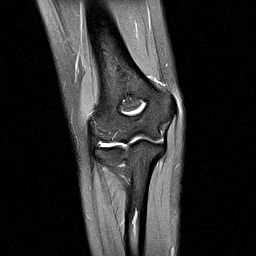
[im 23/29]
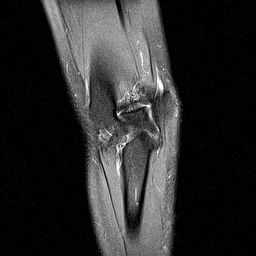
[im 26/29]
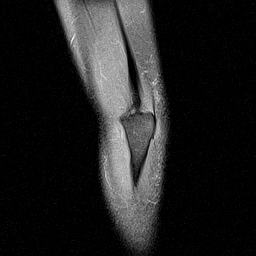
[im 29/29]
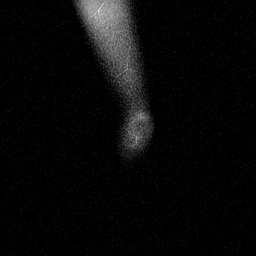

[40 of 40 positions shown; findings below may reference images not displayed]

FINDINGS: TENDONS

Common forearm flexor origin: Intact with normal signal.

Common forearm extensor origin: Intact with normal signal.

Biceps: Intact.

Triceps: Intact.

LIGAMENTS

Medial stabilizers: Intact.

Lateral stabilizers:  Intact.

Cartilage: Well preserved. No subchondral signal abnormalities
identified.

Joint: No evidence of joint effusion or intra-articular loose body.

Cubital tunnel: Unremarkable.  The ulnar nerve appears normal.

Bones: Unremarkable.
IMPRESSION: Negative MRI of the left elbow. No explanation for the patient's
symptoms identified.

## 2016-07-04 ENCOUNTER — Telehealth: Payer: Self-pay | Admitting: Family Medicine

## 2016-07-05 ENCOUNTER — Ambulatory Visit: Payer: 59 | Admitting: Adult Health

## 2016-07-05 DIAGNOSIS — R079 Chest pain, unspecified: Secondary | ICD-10-CM | POA: Diagnosis not present

## 2016-10-01 DIAGNOSIS — M940 Chondrocostal junction syndrome [Tietze]: Secondary | ICD-10-CM | POA: Diagnosis not present

## 2016-10-01 DIAGNOSIS — S43432A Superior glenoid labrum lesion of left shoulder, initial encounter: Secondary | ICD-10-CM | POA: Diagnosis not present

## 2016-10-15 DIAGNOSIS — R079 Chest pain, unspecified: Secondary | ICD-10-CM | POA: Diagnosis not present

## 2016-10-26 DIAGNOSIS — R799 Abnormal finding of blood chemistry, unspecified: Secondary | ICD-10-CM | POA: Diagnosis not present

## 2016-11-09 DIAGNOSIS — M940 Chondrocostal junction syndrome [Tietze]: Secondary | ICD-10-CM | POA: Diagnosis not present

## 2016-11-09 DIAGNOSIS — Z23 Encounter for immunization: Secondary | ICD-10-CM | POA: Diagnosis not present

## 2016-11-09 DIAGNOSIS — R202 Paresthesia of skin: Secondary | ICD-10-CM | POA: Diagnosis not present

## 2017-02-05 DIAGNOSIS — J029 Acute pharyngitis, unspecified: Secondary | ICD-10-CM | POA: Diagnosis not present

## 2017-03-25 DIAGNOSIS — Z6821 Body mass index (BMI) 21.0-21.9, adult: Secondary | ICD-10-CM | POA: Diagnosis not present

## 2017-03-25 DIAGNOSIS — N920 Excessive and frequent menstruation with regular cycle: Secondary | ICD-10-CM | POA: Diagnosis not present

## 2017-03-25 DIAGNOSIS — Z01419 Encounter for gynecological examination (general) (routine) without abnormal findings: Secondary | ICD-10-CM | POA: Diagnosis not present

## 2017-07-11 DIAGNOSIS — Z13220 Encounter for screening for lipoid disorders: Secondary | ICD-10-CM | POA: Diagnosis not present

## 2017-07-11 DIAGNOSIS — Z0000 Encounter for general adult medical examination without abnormal findings: Secondary | ICD-10-CM | POA: Diagnosis not present

## 2017-09-27 DIAGNOSIS — R197 Diarrhea, unspecified: Secondary | ICD-10-CM | POA: Diagnosis not present

## 2017-09-27 DIAGNOSIS — R109 Unspecified abdominal pain: Secondary | ICD-10-CM | POA: Diagnosis not present

## 2017-11-25 DIAGNOSIS — Z23 Encounter for immunization: Secondary | ICD-10-CM | POA: Diagnosis not present

## 2018-03-03 DIAGNOSIS — Z1389 Encounter for screening for other disorder: Secondary | ICD-10-CM | POA: Diagnosis not present

## 2018-03-03 DIAGNOSIS — Z130 Encounter for screening for diseases of the blood and blood-forming organs and certain disorders involving the immune mechanism: Secondary | ICD-10-CM | POA: Diagnosis not present

## 2018-03-03 DIAGNOSIS — Z01419 Encounter for gynecological examination (general) (routine) without abnormal findings: Secondary | ICD-10-CM | POA: Diagnosis not present
